# Patient Record
Sex: Female | Born: 1995 | Race: White | Hispanic: No | Marital: Single | Smoking: Never smoker
Health system: Southern US, Community
[De-identification: ages and names within clinical notes are randomized; demographics above are authoritative.]

## PROBLEM LIST (undated history)

## (undated) DIAGNOSIS — K900 Celiac disease: Secondary | ICD-10-CM

---
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
## 2015-08-06 ENCOUNTER — Observation Stay
Admission: EM | Admit: 2015-08-06 | Discharge: 2015-08-07 | Disposition: A | Discharge status: Home / Self Care | Payer: Commercial Indemnity | Attending: Surgery | Admitting: Surgery

## 2015-08-06 ENCOUNTER — Emergency Department: Payer: Commercial Indemnity

## 2015-08-06 ENCOUNTER — Encounter: Payer: Self-pay | Admitting: *Deleted

## 2015-08-06 DIAGNOSIS — Z79899 Other long term (current) drug therapy: Secondary | ICD-10-CM | POA: Diagnosis not present

## 2015-08-06 DIAGNOSIS — K900 Celiac disease: Secondary | ICD-10-CM | POA: Diagnosis not present

## 2015-08-06 DIAGNOSIS — K37 Unspecified appendicitis: Secondary | ICD-10-CM | POA: Diagnosis present

## 2015-08-06 DIAGNOSIS — K3580 Unspecified acute appendicitis: Secondary | ICD-10-CM | POA: Diagnosis not present

## 2015-08-06 DIAGNOSIS — Z793 Long term (current) use of hormonal contraceptives: Secondary | ICD-10-CM | POA: Diagnosis not present

## 2015-08-06 DIAGNOSIS — E119 Type 2 diabetes mellitus without complications: Secondary | ICD-10-CM | POA: Insufficient documentation

## 2015-08-06 HISTORY — DX: Celiac disease: K90.0

## 2015-08-06 LAB — CBC
HCT: 36.4 % (ref 35.0–47.0)
Hemoglobin: 11.5 g/dL — ABNORMAL LOW (ref 12.0–16.0)
MCH: 19.7 pg — AB (ref 26.0–34.0)
MCHC: 31.5 g/dL — AB (ref 32.0–36.0)
MCV: 62.7 fL — AB (ref 80.0–100.0)
PLATELETS: 279 10*3/uL (ref 150–440)
RBC: 5.81 MIL/uL — AB (ref 3.80–5.20)
RDW: 14.9 % — AB (ref 11.5–14.5)
WBC: 11.7 10*3/uL — ABNORMAL HIGH (ref 3.6–11.0)

## 2015-08-06 LAB — COMPREHENSIVE METABOLIC PANEL
ALBUMIN: 4.6 g/dL (ref 3.5–5.0)
ALK PHOS: 68 U/L (ref 38–126)
ALT: 22 U/L (ref 14–54)
AST: 31 U/L (ref 15–41)
Anion gap: 8 (ref 5–15)
BUN: 12 mg/dL (ref 6–20)
CALCIUM: 10 mg/dL (ref 8.9–10.3)
CHLORIDE: 103 mmol/L (ref 101–111)
CO2: 28 mmol/L (ref 22–32)
CREATININE: 0.75 mg/dL (ref 0.44–1.00)
GFR calc Af Amer: >60 mL/min (ref >60)
GFR calc non Af Amer: >60 mL/min (ref >60)
GLUCOSE: 117 mg/dL — AB (ref 65–99)
Potassium: 3.7 mmol/L (ref 3.5–5.1)
SODIUM: 139 mmol/L (ref 135–145)
Total Bilirubin: 0.4 mg/dL (ref 0.3–1.2)
Total Protein: 8.4 g/dL — ABNORMAL HIGH (ref 6.5–8.1)

## 2015-08-06 LAB — URINALYSIS COMPLETE WITH MICROSCOPIC (ARMC ONLY)
Bilirubin Urine: NEGATIVE
GLUCOSE, UA: NEGATIVE mg/dL
Hgb urine dipstick: NEGATIVE
KETONES UR: NEGATIVE mg/dL
Nitrite: NEGATIVE
PROTEIN: NEGATIVE mg/dL
Specific Gravity, Urine: 1.002 — ABNORMAL LOW (ref 1.005–1.030)
pH: 7 (ref 5.0–8.0)

## 2015-08-06 LAB — LIPASE, BLOOD: LIPASE: 38 U/L (ref 22–51)

## 2015-08-06 MED ORDER — IOHEXOL 300 MG/ML  SOLN
75.0000 mL | Freq: Once | INTRAMUSCULAR | Status: AC | PRN
  Administered 2015-08-06: 75 mL via INTRAVENOUS

## 2015-08-06 MED ORDER — IOHEXOL 240 MG/ML SOLN
25.0000 mL | Freq: Once | INTRAMUSCULAR | Status: AC | PRN
  Administered 2015-08-06: 25 mL via ORAL

## 2015-08-06 NOTE — ED Notes (Signed)
Patient transported to CT via stretcher.

## 2015-08-06 NOTE — ED Notes (Signed)
MD at bedside for eval.

## 2015-08-06 NOTE — ED Notes (Signed)
Pt reports 1 week of epigastric "sharp" pains, worse with walking and also having "pressure" pain on the right lower abdominal area. Denies n/v/d, urinary symptoms, vag bleeding/disharge.

## 2015-08-06 NOTE — ED Notes (Signed)
Lab called to add on urine pregnancy to urine already in the lab.

## 2015-08-06 NOTE — ED Notes (Signed)
poc urine pregnancy negative 

## 2015-08-06 NOTE — ED Provider Notes (Signed)
[facility] Emergency Department Provider Note  ____________________________________________  Time seen: 11:15 PM  I have reviewed the triage vital signs and the nursing notes.   HISTORY  Chief Complaint Abdominal Pain      HPI Maria Mills is a 19 y.o. female presents with generalized abdominal pain worse on the right side currently 7 out of 10. Patient also admits to epigastric discomfort times one week. Patient states that the pain is currently worse with walking described as pressure in the right lower quadrant of the abdomen. She denies any fever denies any nausea vomiting or urinary symptoms.    Past Medical History  Diagnosis Date  . Celiac disease     There are no active problems to display for this patient.   Past Surgical History  none No current outpatient prescriptions on file.  Allergies No known drug allergies No family history on file.  Social History Social History  Substance Use Topics  . Smoking status: Never Smoker   . Smokeless tobacco: None  . Alcohol Use: Yes    Review of Systems  Constitutional: Negative for fever. Eyes: Negative for visual changes. ENT: Negative for sore throat. Cardiovascular: Negative for chest pain. Respiratory: Negative for shortness of breath. Gastrointestinal: positive for abdominal pain, vomiting. Negative for diarrhea. Genitourinary: Negative for dysuria. Musculoskeletal: Negative for back pain. Skin: Negative for rash. Neurological: Negative for headaches, focal weakness or numbness.   10-point ROS otherwise negative.  ____________________________________________   PHYSICAL EXAM:  VITAL SIGNS: ED Triage Vitals  Enc Vitals Group     BP 08/06/15 2009 112/75 mmHg     Pulse Rate 08/06/15 2009 87     Resp 08/06/15 2009 18     Temp 08/06/15 2007 98.6 F (37 C)     Temp Source 08/06/15 2007 Oral     SpO2 08/06/15 2009 100 %     Weight 08/06/15 2009 127 lb (57.607 kg)   Height 08/06/15 2009  (1.626 m)     Head Cir --      Peak Flow --      Pain Score 08/06/15 2009 0     Pain Loc --      Pain Edu? --      Excl. in GC? --      Constitutional: Alert and oriented. Well appearing and in no distress. Eyes: Conjunctivae are normal. PERRL. Normal extraocular movements. ENT   Head: Normocephalic and atraumatic.   Nose: No congestion/rhinnorhea.   Mouth/Throat: Mucous membranes are moist.   Neck: No stridor. Hematological/Lymphatic/Immunilogical: No cervical lymphadenopathy. Cardiovascular: Normal rate, regular rhythm. Normal and symmetric distal pulses are present in all extremities. No murmurs, rubs, or gallops. Respiratory: Normal respiratory effort without tachypnea nor retractions. Breath sounds are clear and equal bilaterally. No wheezes/rales/rhonchi. Gastrointestinal: RLQ pain with palpation. No distention. There is no CVA tenderness. Genitourinary: deferred Musculoskeletal: Nontender with normal range of motion in all extremities. No joint effusions.  No lower extremity tenderness nor edema. Neurologic:  Normal speech and language. No gross focal neurologic deficits are appreciated. Speech is normal.  Skin:  Skin is warm, dry and intact. No rash noted. Psychiatric: Mood and affect are normal. Speech and behavior are normal. Patient exhibits appropriate insight and judgment.  ____________________________________________    LABS (pertinent positives/negatives)  Labs Reviewed  COMPREHENSIVE METABOLIC PANEL - Abnormal; Notable for the following:    Glucose, Bld 117 (*)    Total Protein 8.4 (*)    All other components within normal limits  CBC - Abnormal; Notable for the following:    WBC 11.7 (*)    RBC 5.81 (*)    Hemoglobin 11.5 (*)    MCV 62.7 (*)    MCH 19.7 (*)    MCHC 31.5 (*)    RDW 14.9 (*)    All other components within normal limits  URINALYSIS COMPLETEWITH MICROSCOPIC ([facility] ONLY) - Abnormal; Notable for the  following:    Color, Urine STRAW (*)    APPearance CLEAR (*)    Specific Gravity, Urine 1.002 (*)    Leukocytes, UA TRACE (*)    Bacteria, UA RARE (*)    Squamous Epithelial / LPF 0-5 (*)    All other components within normal limits  LIPASE, BLOOD  POC URINE PREG, ED       RADIOLOGY      CT Abdomen Pelvis W Contrast (Final result) Result time: 08/07/15 00:28:27   Final result by Rad Results In Interface (08/07/15 00:28:27)   Narrative:   CLINICAL DATA: Generalized abdominal pain. Epigastric pain with nausea and vomiting for 1 day.  EXAM: CT ABDOMEN AND PELVIS WITH CONTRAST  TECHNIQUE: Multidetector CT imaging of the abdomen and pelvis was performed using the standard protocol following bolus administration of intravenous contrast.  CONTRAST: 75mL OMNIPAQUE IOHEXOL 300 MG/ML SOLN  COMPARISON: None.  FINDINGS: Lower chest: The included lung bases are clear.  Liver: Normal in size and density. No focal lesion.  Hepatobiliary: Gallbladder physiologically distended. No biliary dilatation.  Pancreas: Normal.  Spleen: Normal. Small splenule anteriorly.  Adrenal glands: No nodule.  Kidneys: Symmetric renal enhancement. No hydronephrosis. No localizing renal abnormality.  Stomach/Bowel: Stomach physiologically distended. There are no dilated or thickened small bowel loops. Small- moderate volume of stool throughout the colon without colonic wall thickening.  Appendix: Dilated measuring 11 mm with enhancement and surrounding periappendiceal inflammatory change. No surrounding fluid collection or abscess. No perforation.  Vascular/Lymphatic: Multiple small mesenteric lymph nodes, most prominent in the ileocolic chain. No retroperitoneal adenopathy. Abdominal aorta is normal in caliber.  Reproductive: Uterus is retroverted. The ovaries are not clearly defined.  Bladder: Decompressed and not well evaluated.  Other: No free air, free fluid, or  intra-abdominal fluid collection.  Musculoskeletal: There are no acute or suspicious osseous abnormalities.  IMPRESSION: Acute appendicitis. No perforation or abscess. Multiple prominent lymph nodes in the ileocolic chain, likely reactive.   Electronically Signed By: Rubye Oaks M.D. On: 08/07/2015 00:28          INITIAL IMPRESSION / ASSESSMENT AND PLAN / ED COURSE  Pertinent labs & imaging results that were available during my care of the patient were reviewed by me and considered in my medical decision making (see chart for details).  History of physical exam consistent with acute appendicitis as such patient and her mother informed of CT findings. Patient discussed with Dr. Juliann Pulse surgeon on call  ____________________________________________   FINAL CLINICAL IMPRESSION(S) / ED DIAGNOSES  Final diagnoses:  Acute appendicitis, unspecified acute appendicitis type      Darci Current, MD 08/07/15 [phone]

## 2015-08-07 ENCOUNTER — Encounter
Admission: EM | Disposition: A | Discharge status: Home / Self Care | Payer: Self-pay | Source: Home / Self Care | Attending: Emergency Medicine

## 2015-08-07 ENCOUNTER — Encounter: Payer: Self-pay | Admitting: Surgery

## 2015-08-07 ENCOUNTER — Emergency Department: Payer: Commercial Indemnity | Admitting: Anesthesiology

## 2015-08-07 DIAGNOSIS — K37 Unspecified appendicitis: Secondary | ICD-10-CM | POA: Diagnosis present

## 2015-08-07 DIAGNOSIS — K353 Acute appendicitis with localized peritonitis: Secondary | ICD-10-CM | POA: Diagnosis not present

## 2015-08-07 HISTORY — PX: LAPAROSCOPIC APPENDECTOMY: SHX408

## 2015-08-07 LAB — POCT PREGNANCY, URINE: PREG TEST UR: NEGATIVE

## 2015-08-07 LAB — PREGNANCY, URINE: Preg Test, Ur: NEGATIVE

## 2015-08-07 SURGERY — APPENDECTOMY, LAPAROSCOPIC
Anesthesia: General

## 2015-08-07 MED ORDER — OXYCODONE-ACETAMINOPHEN 5-325 MG PO TABS: 1.0000 | ORAL_TABLET | ORAL | Status: AC | PRN

## 2015-08-07 MED ORDER — ONDANSETRON HCL 4 MG/2ML IJ SOLN
INTRAMUSCULAR | Status: DC | PRN
Start: 2015-08-07 — End: 2015-08-07
  Administered 2015-08-07: 4 mg via INTRAVENOUS

## 2015-08-07 MED ORDER — ACETAMINOPHEN 325 MG PO TABS: 650.0000 mg | ORAL_TABLET | Freq: Four times a day (QID) | ORAL | Status: DC | PRN

## 2015-08-07 MED ORDER — FENTANYL CITRATE (PF) 100 MCG/2ML IJ SOLN
INTRAMUSCULAR | Status: AC
  Filled 2015-08-07: qty 2

## 2015-08-07 MED ORDER — ONDANSETRON HCL 4 MG/2ML IJ SOLN: 4.0000 mg | Freq: Once | INTRAMUSCULAR | Status: DC | PRN

## 2015-08-07 MED ORDER — PANTOPRAZOLE SODIUM 40 MG IV SOLR: 40.0000 mg | Freq: Every day | INTRAVENOUS | Status: DC

## 2015-08-07 MED ORDER — LIDOCAINE HCL (CARDIAC) 20 MG/ML IV SOLN
INTRAVENOUS | Status: DC | PRN
  Administered 2015-08-07: 30 mg via INTRAVENOUS

## 2015-08-07 MED ORDER — LIDOCAINE HCL 1 % IJ SOLN
INTRAMUSCULAR | Status: DC | PRN
  Administered 2015-08-07: 5 mL

## 2015-08-07 MED ORDER — ERTAPENEM SODIUM 1 G IJ SOLR
1.0000 g | Freq: Once | INTRAMUSCULAR | Status: AC
  Administered 2015-08-07: 1 g via INTRAVENOUS
  Filled 2015-08-07: qty 1

## 2015-08-07 MED ORDER — GLYCOPYRROLATE 0.2 MG/ML IJ SOLN
INTRAMUSCULAR | Status: DC | PRN
  Administered 2015-08-07: 0.6 mg via INTRAVENOUS

## 2015-08-07 MED ORDER — BUPIVACAINE-EPINEPHRINE (PF) 0.25% -1:200000 IJ SOLN
INTRAMUSCULAR | Status: DC | PRN
  Administered 2015-08-07: 5 mL via PERINEURAL

## 2015-08-07 MED ORDER — FENTANYL CITRATE (PF) 100 MCG/2ML IJ SOLN
25.0000 ug | INTRAMUSCULAR | Status: AC | PRN
  Administered 2015-08-07 (×6): 25 ug via INTRAVENOUS

## 2015-08-07 MED ORDER — PROPOFOL 10 MG/ML IV BOLUS
INTRAVENOUS | Status: DC | PRN
  Administered 2015-08-07: 160 mg via INTRAVENOUS

## 2015-08-07 MED ORDER — HYDROMORPHONE HCL 1 MG/ML IJ SOLN
0.5000 mg | INTRAMUSCULAR | Status: DC | PRN
  Administered 2015-08-07 (×3): 1 mg via INTRAVENOUS
  Filled 2015-08-07 (×3): qty 1

## 2015-08-07 MED ORDER — DEXAMETHASONE SODIUM PHOSPHATE 4 MG/ML IJ SOLN
INTRAMUSCULAR | Status: DC | PRN
  Administered 2015-08-07: 4 mg via INTRAVENOUS

## 2015-08-07 MED ORDER — NEOSTIGMINE METHYLSULFATE 10 MG/10ML IV SOLN
INTRAVENOUS | Status: DC | PRN
  Administered 2015-08-07: 3 mg via INTRAVENOUS

## 2015-08-07 MED ORDER — ONDANSETRON HCL 4 MG/2ML IJ SOLN: 4.0000 mg | Freq: Four times a day (QID) | INTRAMUSCULAR | Status: DC | PRN

## 2015-08-07 MED ORDER — ROCURONIUM BROMIDE 100 MG/10ML IV SOLN: INTRAVENOUS | Status: DC | PRN

## 2015-08-07 MED ORDER — SODIUM CHLORIDE 0.9 % IR SOLN
Status: DC | PRN
  Administered 2015-08-07: 1000 mL

## 2015-08-07 MED ORDER — ROCURONIUM BROMIDE 100 MG/10ML IV SOLN
INTRAVENOUS | Status: DC | PRN
  Administered 2015-08-07: 20 mg via INTRAVENOUS

## 2015-08-07 MED ORDER — HYDROMORPHONE HCL 1 MG/ML IJ SOLN: 0.5000 mg | INTRAMUSCULAR | Status: DC | PRN

## 2015-08-07 MED ORDER — OXYCODONE-ACETAMINOPHEN 5-325 MG PO TABS
1.0000 | ORAL_TABLET | ORAL | Status: DC | PRN
  Administered 2015-08-07: 2 via ORAL
  Filled 2015-08-07: qty 2

## 2015-08-07 MED ORDER — FENTANYL CITRATE (PF) 100 MCG/2ML IJ SOLN
INTRAMUSCULAR | Status: DC | PRN
  Administered 2015-08-07: 25 ug via INTRAVENOUS
  Administered 2015-08-07: 50 ug via INTRAVENOUS
  Administered 2015-08-07: 25 ug via INTRAVENOUS
  Administered 2015-08-07: 50 ug via INTRAVENOUS
  Administered 2015-08-07: 100 ug via INTRAVENOUS

## 2015-08-07 MED ORDER — SUCCINYLCHOLINE CHLORIDE 20 MG/ML IJ SOLN
INTRAMUSCULAR | Status: DC | PRN
  Administered 2015-08-07: 80 mg via INTRAVENOUS

## 2015-08-07 MED ORDER — FENTANYL CITRATE (PF) 100 MCG/2ML IJ SOLN: 25.0000 ug | INTRAMUSCULAR | Status: DC | PRN

## 2015-08-07 MED ORDER — ONDANSETRON 4 MG PO TBDP: 4.0000 mg | ORAL_TABLET | Freq: Four times a day (QID) | ORAL | Status: DC | PRN

## 2015-08-07 MED ORDER — MIDAZOLAM HCL 2 MG/2ML IJ SOLN
INTRAMUSCULAR | Status: DC | PRN
  Administered 2015-08-07: 2 mg via INTRAVENOUS

## 2015-08-07 MED ORDER — LACTATED RINGERS IV SOLN
INTRAVENOUS | Status: DC | PRN
  Administered 2015-08-07 (×2): via INTRAVENOUS

## 2015-08-07 MED ORDER — SODIUM CHLORIDE 0.9 % IV SOLN
INTRAVENOUS | Status: DC
  Administered 2015-08-07 (×2): via INTRAVENOUS

## 2015-08-07 MED ORDER — ACETAMINOPHEN 650 MG RE SUPP: 650.0000 mg | Freq: Four times a day (QID) | RECTAL | Status: DC | PRN

## 2015-08-07 SURGICAL SUPPLY — 42 items
APPLIER CLIP ROT 10 11.4 M/L (STAPLE) ×2
BAG COUNTER SPONGE EZ (MISCELLANEOUS) IMPLANT
BENZOIN TINCTURE PRP APPL 2/3 (GAUZE/BANDAGES/DRESSINGS) ×2 IMPLANT
BLADE SURG SZ11 CARB STEEL (BLADE) ×2 IMPLANT
CANISTER SUCT 1200ML W/VALVE (MISCELLANEOUS) ×2 IMPLANT
CATH TRAY 16F METER LATEX (MISCELLANEOUS) ×2 IMPLANT
CHLORAPREP W/TINT 26ML (MISCELLANEOUS) ×2 IMPLANT
CLIP APPLIE ROT 10 11.4 M/L (STAPLE) ×1 IMPLANT
CUTTER LINEAR ENDO 35 ART THIN (STAPLE) ×2 IMPLANT
DRSG TEGADERM 2-3/8X2-3/4 SM (GAUZE/BANDAGES/DRESSINGS) ×6 IMPLANT
DRSG TEGADERM 4X4.75 (GAUZE/BANDAGES/DRESSINGS) ×2 IMPLANT
DRSG TELFA 3X8 NADH (GAUZE/BANDAGES/DRESSINGS) ×2 IMPLANT
ENDOLOOP SUT PDS II  0 18 (SUTURE)
ENDOLOOP SUT PDS II 0 18 (SUTURE) IMPLANT
ENDOPOUCH RETRIEVER 10 (MISCELLANEOUS) ×2 IMPLANT
GLOVE BIO SURGEON STRL SZ7.5 (GLOVE) ×8 IMPLANT
GOWN STRL REUS W/ TWL LRG LVL3 (GOWN DISPOSABLE) ×2 IMPLANT
GOWN STRL REUS W/TWL LRG LVL3 (GOWN DISPOSABLE) ×2
IRRIGATION STRYKERFLOW (MISCELLANEOUS) ×1 IMPLANT
IRRIGATOR STRYKERFLOW (MISCELLANEOUS) ×2
IV NS 1000ML (IV SOLUTION) ×1
IV NS 1000ML BAXH (IV SOLUTION) ×1 IMPLANT
KIT RM TURNOVER STRD PROC AR (KITS) ×2 IMPLANT
LABEL OR SOLS (LABEL) IMPLANT
NEEDLE HYPO 25X1 1.5 SAFETY (NEEDLE) ×2 IMPLANT
NS IRRIG 500ML POUR BTL (IV SOLUTION) ×2 IMPLANT
PACK LAP CHOLECYSTECTOMY (MISCELLANEOUS) ×2 IMPLANT
PAD GROUND ADULT SPLIT (MISCELLANEOUS) ×2 IMPLANT
RELOAD CUTTER ETS 35MM STAND (ENDOMECHANICALS) ×4 IMPLANT
SCISSORS METZENBAUM CVD 33 (INSTRUMENTS) ×2 IMPLANT
SEAL FOR SCOPE WARMER C3101 (MISCELLANEOUS) IMPLANT
SLEEVE ENDOPATH XCEL 5M (ENDOMECHANICALS) ×2 IMPLANT
STRIP CLOSURE SKIN 1/2X4 (GAUZE/BANDAGES/DRESSINGS) ×2 IMPLANT
SUT MNCRL 4-0 (SUTURE) ×1
SUT MNCRL 4-0 27XMFL (SUTURE) ×1
SUT VICRYL 0 AB UR-6 (SUTURE) ×4 IMPLANT
SUTURE MNCRL 4-0 27XMF (SUTURE) ×1 IMPLANT
SWABSTK COMLB BENZOIN TINCTURE (MISCELLANEOUS) ×2 IMPLANT
TROCAR XCEL BLUNT TIP 100MML (ENDOMECHANICALS) ×2 IMPLANT
TROCAR XCEL NON-BLD 5MMX100MML (ENDOMECHANICALS) ×2 IMPLANT
TUBING INSUFFLATOR HI FLOW (MISCELLANEOUS) ×2 IMPLANT
WATER STERILE IRR 1000ML POUR (IV SOLUTION) IMPLANT

## 2015-08-07 NOTE — ED Notes (Signed)
Lab notified to add on urine pregnancy to urine sample sent earlier this visit.

## 2015-08-07 NOTE — Discharge Instructions (Signed)
Do not drive on pain medications °Do not lift greater than 15 lbs for a period of 6 weeks °Call or return to ER if you develop fever greater than 101.5, nausea/vomiting, increased pain, redness/drainage from incisions °Take bandages off in 48 hours.  Okay to shower with bandages on or after they come off, no tub baths °

## 2015-08-07 NOTE — Op Note (Signed)
Preoperative dx: Appendicitis Postoperative dx: Appendicitis Procedure: Laparoscopic appendectomy  Surgeon: Marie Chow Anesthesia: GETA EBL: 10 ml Specimen: Appendix.  Indication for procedure: Maria Mills presented with RLQ pain and clinical and radiographic signs of appendicitis.  We brought her to the OR for surgical management of appendicitis.  Details of procedure:  Informed consent was obtained.  Maria Mills was brought to the or suite and laid on the OR table.  She was induced, intubated and general anesthesia administered.  Her abdomen was prepped and draped.  Time out performed.  Incision was made over umbilicus.  Deepened down to fascia and fascia incised.  Two stay sutures placed in fasciotomy, hassan trocar placed, abdomen was insufflated.  Two 5 mm trocars were placed under direct visualization in RLQ and suprapubic.  The appendix was visualized and remarkably inflammed.  Hole was made in mesoappendix and appendix transected flush with cecum using endoGIA stapler.  Mesoappendix was then divided with 2 firings of the endoGIA.  The mesoappendix was markedly thickened and the right colon and TI was partially mobilized to ensure that the mesoappendix was free of any bowel.  In addition, the right ovary was visualized to ensure that the ovary was not associated with the appendix and that this thickened mesoappendix was not due to ovarian involvement.  Appendix then removed using endocatch bag.  The appendix was examined on the back table and was visualized on the colonic side and markedly inflamed at the tip with large amount of periappendiceal scarring.  The abdomen was then irrigated and hemostasis obtained.  All trocars were then removed under direct visualization.  Supraumbilical fascia closed using previously placed stay sutures.  All skin sites closed with 4-0 monocryl interruped deep dermal sutures.  Steristrips, telfa and tegaderm were used to complete dressing.  Patient awoken, extubated  and brought to PACU.  No immediate complications, needle, sponge and instrument count correct at end of procedure.

## 2015-08-07 NOTE — Transfer of Care (Signed)
Immediate Anesthesia Transfer of Care Note  Patient: Maria Mills  Procedure(s) Performed: Procedure(s): APPENDECTOMY LAPAROSCOPIC (N/A)  Patient Location: PACU  Anesthesia Type:General  Level of Consciousness: awake, alert , oriented and patient cooperative  Airway & Oxygen Therapy: Patient Spontanous Breathing and Patient connected to face mask oxygen  Post-op Assessment: Report given to RN and Post -op Vital signs reviewed and stable  Post vital signs: Reviewed and stable  Last Vitals:  Filed Vitals:   08/06/15 2344  BP: 121/70  Pulse: 84  Temp:   Resp: 18    Complications: No apparent anesthesia complications

## 2015-08-07 NOTE — H&P (Signed)
CC: Abdominal pain, bloating, nausea/vomiting  HPI: Maria Mills is a pleasant 19 yo F who presents with approx 1 week of bloating and abdominal pain.  Pain initially in epigastric area but is tender to palpation RLQ.  + Anorexia.  Has never had this pain before.  Has celiac disease but pain is not like that and is very rigid at maintaining a gluten free diet.  No fevers/chills, night sweats, shortness of breath, chest pain, diarrhea/constipation, dysuria/hematuria.  WBC 11, CT scan shows thickened, dilated appendix with periappendiceal stranding.  Active Ambulatory Problems    Diagnosis Date Noted  . No Active Ambulatory Problems   Resolved Ambulatory Problems    Diagnosis Date Noted  . No Resolved Ambulatory Problems   Past Medical History  Diagnosis Date  . Celiac disease    History reviewed. No pertinent past surgical history.     Medication List    ASK your doctor about these medications        JUNEL 1.5/30 1.5-30 MG-MCG tablet  Generic drug:  Norethindrone Acetate-Ethinyl Estradiol  Take 1 tablet by mouth daily.       No Known Allergies   Social History   Social History  . Marital Status: Single    Spouse Name: N/A  . Number of Children: N/A  . Years of Education: N/A   Occupational History  . Not on file.   Social History Main Topics  . Smoking status: Never Smoker   . Smokeless tobacco: Not on file  . Alcohol Use: Yes  . Drug Use: No  . Sexual Activity: Not on file   Other Topics Concern  . Not on file   Social History Narrative  . No narrative on file   No family history on file.  GM with DM  ROS: Full ROS obtained, pertinent positives and negatives as above.  Blood pressure 121/70, pulse 84, temperature 98.6 F (37 C), temperature source Oral, resp. rate 18, height  (1.626 m), weight 127 lb (57.607 kg), last menstrual period 07/22/2015, SpO2 100 %. GEN: NAD/A&Ox3 FACE: no obvious facial trauma, normal external nose, normal external  ears EYES: no scleral icterus, no conjunctivitis HEAD: normocephalic atraumatic CV: RRR, no MRG RESP: moving air well, lungs clear ABD: soft, tender to palp RLQ with rebound, nondistended EXT: moving all ext well, strength 5/5 NEURO: cnII-XII grossly intact, sensation intact all 4 ext  Labs: Personally reviewed, significant for WBC 11.7  Imaging: Personally reviewed, significant for dilated thickened appendix with periappendiceal stranding  A/P 19 yo F with acute appendicitis.  Although I am not sure how to account for diffuse abdominal pain, she is quite tender in her RLQ.  WBC elevated, radiographic appendicitis.  I have recommended appendectomy and have discussed the procedure with patient and her mother.  I have described the potential risks and benefits and she would like to proceed.

## 2015-08-07 NOTE — Anesthesia Preprocedure Evaluation (Signed)
Anesthesia Evaluation  Patient identified by MRN, date of birth, ID band Patient awake    Reviewed: Allergy & Precautions, NPO status , Patient's Chart, lab work & pertinent test results  Airway Mallampati: II       Dental no notable dental hx.    Pulmonary neg pulmonary ROS,    Pulmonary exam normal        Cardiovascular negative cardio ROS Normal cardiovascular exam     Neuro/Psych negative neurological ROS     GI/Hepatic negative GI ROS, Neg liver ROS,   Endo/Other  negative endocrine ROS  Renal/GU negative Renal ROS  negative genitourinary   Musculoskeletal negative musculoskeletal ROS (+)   Abdominal Normal abdominal exam  (+)   Peds  Hematology negative hematology ROS (+)   Anesthesia Other Findings   Reproductive/Obstetrics                             Anesthesia Physical Anesthesia Plan  ASA: I and emergent  Anesthesia Plan: General   Post-op Pain Management:    Induction: Intravenous  Airway Management Planned: Oral ETT  Additional Equipment:   Intra-op Plan:   Post-operative Plan: Extubation in OR  Informed Consent: I have reviewed the patients History and Physical, chart, labs and discussed the procedure including the risks, benefits and alternatives for the proposed anesthesia with the patient or authorized representative who has indicated his/her understanding and acceptance.     Plan Discussed with: CRNA  Anesthesia Plan Comments:         Anesthesia Quick Evaluation

## 2015-08-07 NOTE — Brief Op Note (Signed)
08/06/2015 - 08/07/2015  3:59 AM  PATIENT:  Maria Mills  19 y.o. female  PRE-OPERATIVE DIAGNOSIS:  acute appendicitis  POST-OPERATIVE DIAGNOSIS:  acute appendicitis  PROCEDURE:  Procedure(s): APPENDECTOMY LAPAROSCOPIC (N/A)  SURGEON:  Surgeon(s) and Role:    * Ida Rogue, MD - Primary  PHYSICIAN ASSISTANT:   ASSISTANTS: none   ANESTHESIA:   general  EBL:   15 ml  BLOOD ADMINISTERED:none  DRAINS: none   LOCAL MEDICATIONS USED:  LIDOCAINE   SPECIMEN:  Excision  DISPOSITION OF SPECIMEN:  PATHOLOGY  COUNTS:  YES  TOURNIQUET:  * No tourniquets in log *  DICTATION: .Note written in EPIC  PLAN OF CARE: Admit for overnight observation  PATIENT DISPOSITION:  PACU - hemodynamically stable.   Delay start of Pharmacological VTE agent (>24hrs) due to surgical blood loss or risk of bleeding: not applicable

## 2015-08-07 NOTE — ED Notes (Signed)
Admitting MD at bedside for eval.

## 2015-08-07 NOTE — Anesthesia Procedure Notes (Signed)
Procedure Name: Intubation Date/Time: 08/07/2015 2:12 AM Performed by: Waldo Laine Pre-anesthesia Checklist: Emergency Drugs available, Patient identified, Suction available, Patient being monitored and Timeout performed Patient Re-evaluated:Patient Re-evaluated prior to inductionPreoxygenation: Pre-oxygenation with 100% oxygen Intubation Type: IV induction, Rapid sequence and Cricoid Pressure applied Laryngoscope Size: Miller and 1 Grade View: Grade I Tube type: Oral Number of attempts: 1 Airway Equipment and Method: Stylet Placement Confirmation: ETT inserted through vocal cords under direct vision,  positive ETCO2 and breath sounds checked- equal and bilateral Secured at: 20 cm Tube secured with: Tape

## 2015-08-08 LAB — SURGICAL PATHOLOGY

## 2015-08-08 NOTE — Anesthesia Postprocedure Evaluation (Signed)
  Anesthesia Post-op Note  Patient: Maria Mills  Procedure(s) Performed: Procedure(s): APPENDECTOMY LAPAROSCOPIC (N/A)  Anesthesia type:General  Patient location: PACU  Post pain: Pain level controlled  Post assessment: Post-op Vital signs reviewed, Patient's Cardiovascular Status Stable, Respiratory Function Stable, Patent Airway and No signs of Nausea or vomiting  Post vital signs: Reviewed and stable  Last Vitals:  Filed Vitals:   08/07/15 1702  BP: 117/62  Pulse: 77  Temp: 37.1 C  Resp: 16    Level of consciousness: awake, alert  and patient cooperative  Complications: No apparent anesthesia complications

## 2015-08-16 NOTE — Addendum Note (Signed)
Addendum  created 08/16/15 1536 by Lezlie Octave, MD   Modules edited: Anesthesia Events, Narrator   Narrator:  Narrator: Event Log Edited

## 2016-02-29 IMAGING — CT CT ABD-PELV W/ CM
1 of 2 series · 15 of 32 positions shown, 19 images · IV contrast (omnipaque)
Comparison: None.

CLINICAL DATA: Generalized abdominal pain. Epigastric pain with
nausea and vomiting for 1 day.

EXAM:
CT ABDOMEN AND PELVIS WITH CONTRAST
TECHNIQUE: Multidetector CT imaging of the abdomen and pelvis was performed
using the standard protocol following bolus administration of
intravenous contrast.
CONTRAST:  75mL OMNIPAQUE IOHEXOL 300 MG/ML  SOLN

[Series 2: routine abd pel with · axial · 0.56mm/px · z∈[-481,-96]mm · 15 of 85 slices shown, 19 images]
[im 4/85  soft-tissue]
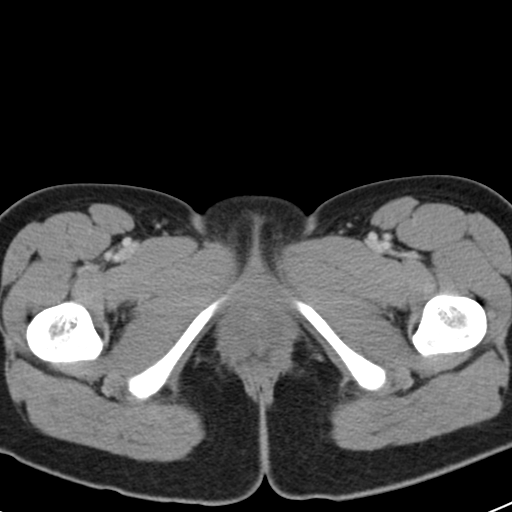
[im 4/85  bone]
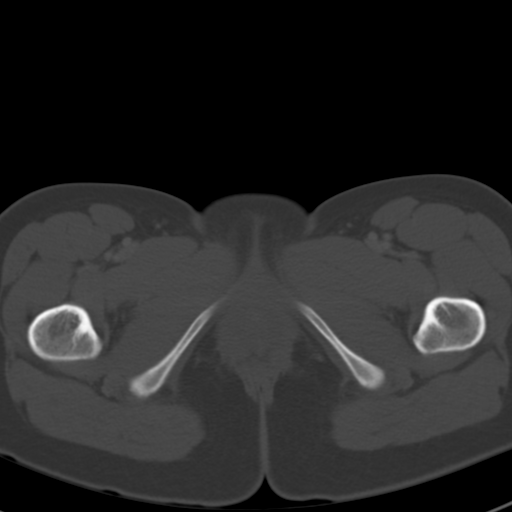
[im 11/85  soft-tissue]
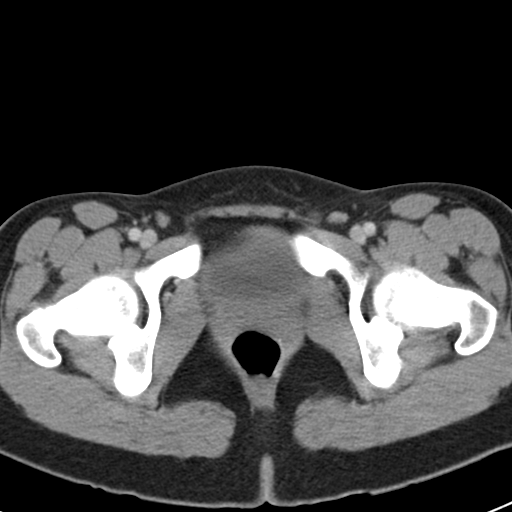
[im 18/85  soft-tissue]
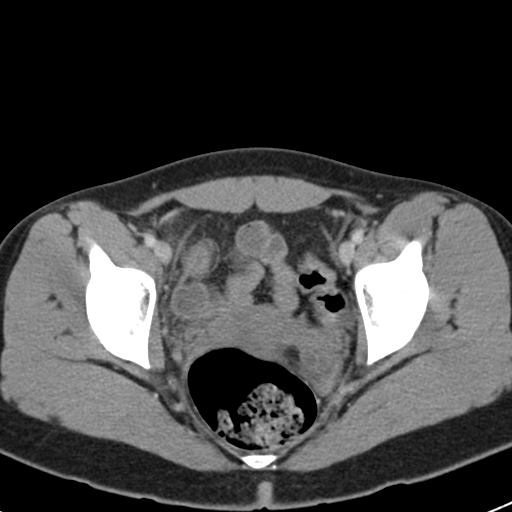
[im 25/85  soft-tissue]
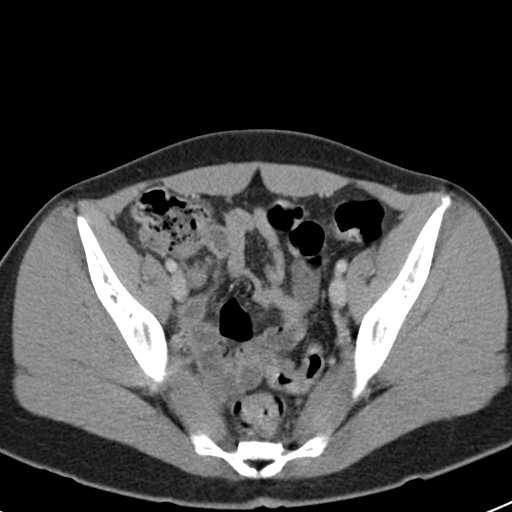
[im 29/85  soft-tissue]
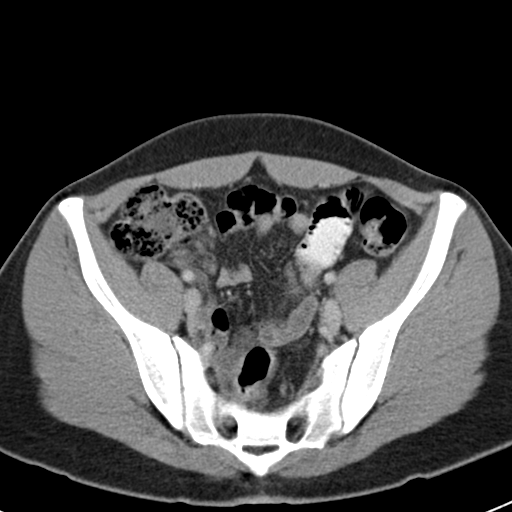
[im 36/85  soft-tissue]
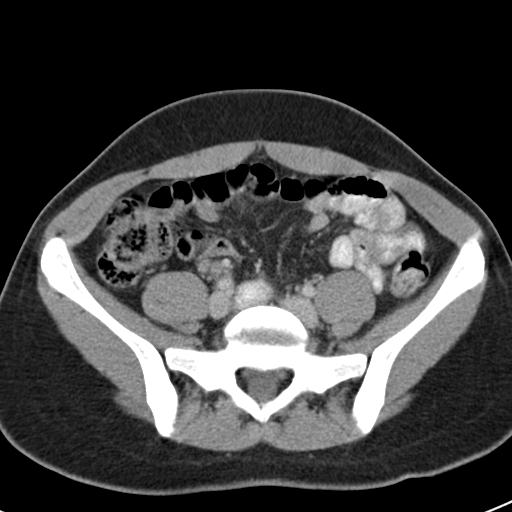
[im 43/85  soft-tissue]
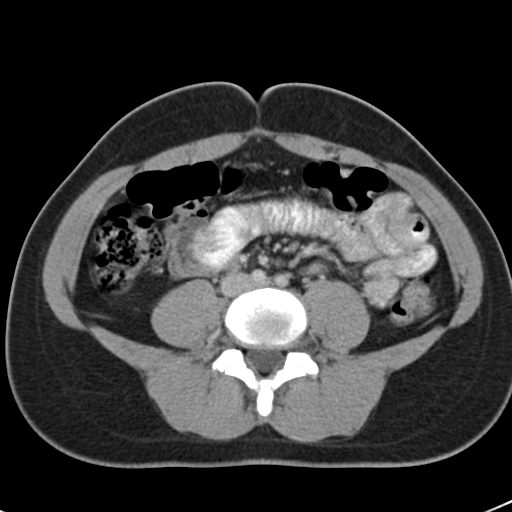
[im 50/85  soft-tissue]
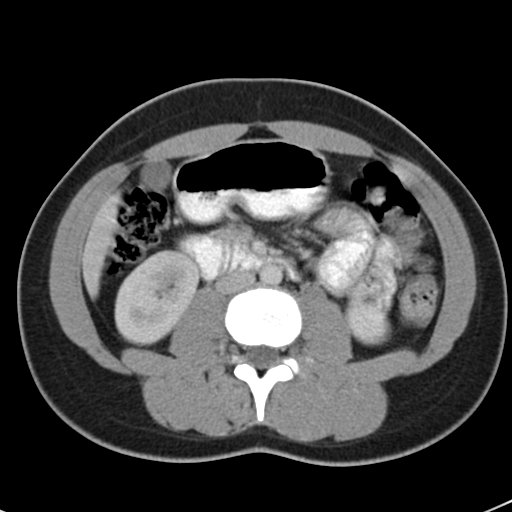
[im 57/85  soft-tissue]
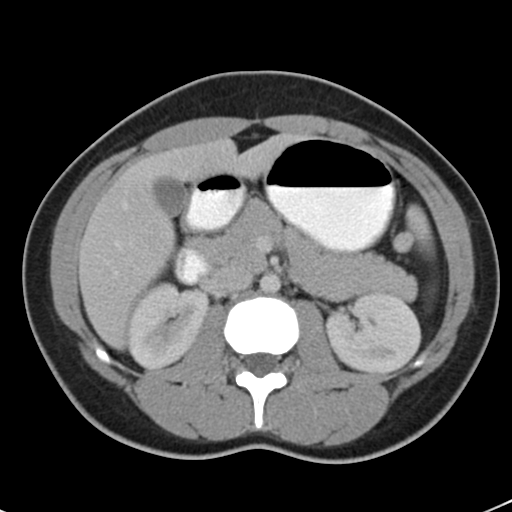
[im 57/85  bone]
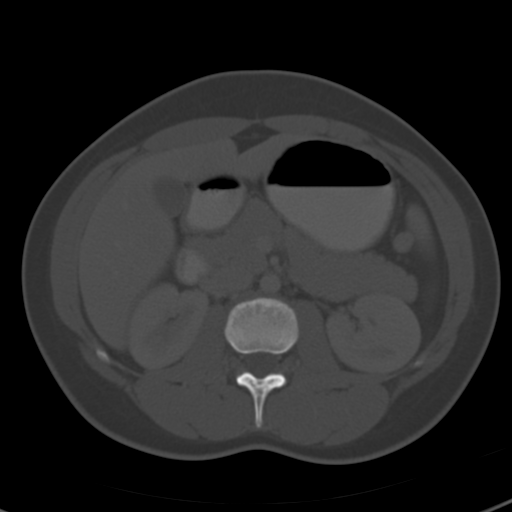
[im 60/85  soft-tissue]
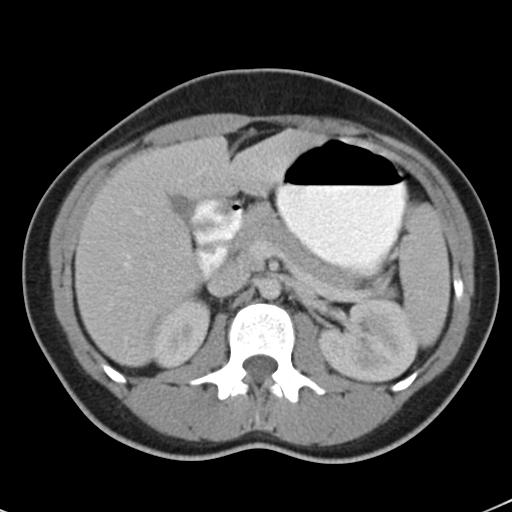
[im 67/85  soft-tissue]
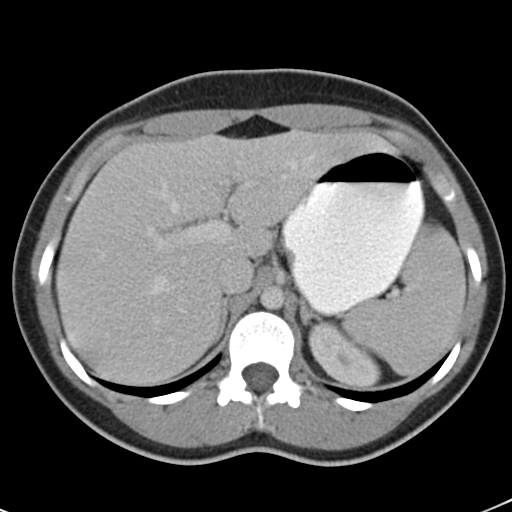
[im 71/85  lung]
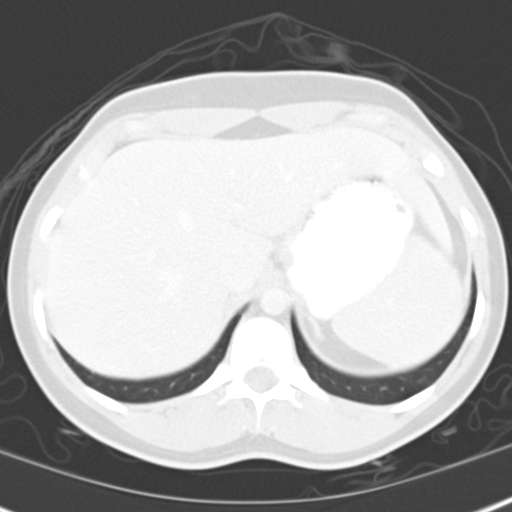
[im 74/85  soft-tissue]
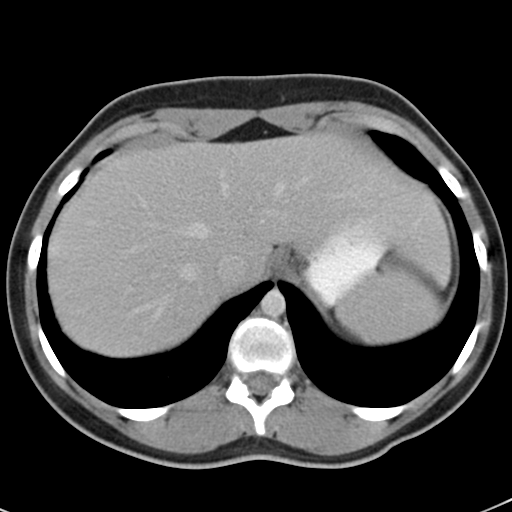
[im 74/85  lung]
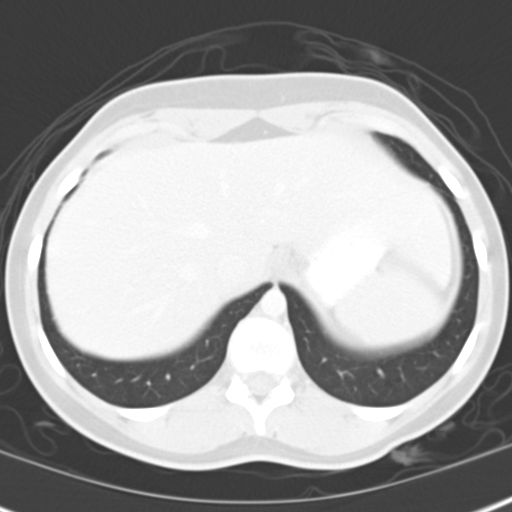
[im 78/85  lung]
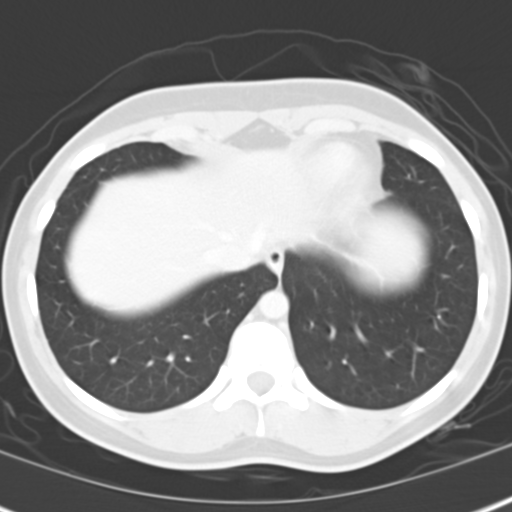
[im 81/85  soft-tissue]
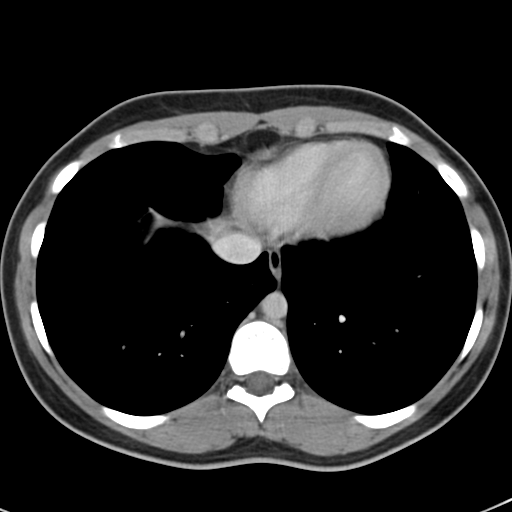
[im 81/85  lung]
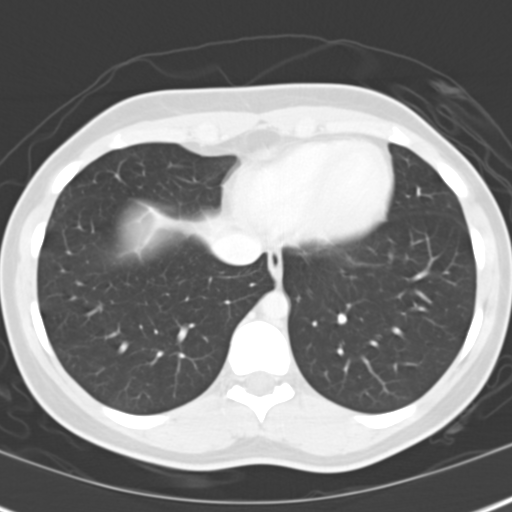

[15 of 32 positions shown; findings below may reference images not displayed]

FINDINGS: Lower chest:  The included lung bases are clear.

Liver: Normal in size and density.  No focal lesion.

Hepatobiliary: Gallbladder physiologically distended. No biliary
dilatation.

Pancreas: Normal.

Spleen: Normal.  Small splenule anteriorly.

Adrenal glands: No nodule.

Kidneys: Symmetric renal enhancement. No hydronephrosis. No
localizing renal abnormality.

Stomach/Bowel: Stomach physiologically distended. There are no
dilated or thickened small bowel loops. Small- moderate volume of
stool throughout the colon without colonic wall thickening.

Appendix: Dilated measuring 11 mm with enhancement and surrounding
periappendiceal inflammatory change. No surrounding fluid collection
or abscess. No perforation.

Vascular/Lymphatic: Multiple small mesenteric lymph nodes, most
prominent in the ileocolic chain. No retroperitoneal adenopathy.
Abdominal aorta is normal in caliber.

Reproductive: Uterus is retroverted. The ovaries are not clearly
defined.

Bladder: Decompressed and not well evaluated.

Other: No free air, free fluid, or intra-abdominal fluid collection.

Musculoskeletal: There are no acute or suspicious osseous
abnormalities.
IMPRESSION: Acute appendicitis. No perforation or abscess. Multiple prominent
lymph nodes in the ileocolic chain, likely reactive.

## 2019-07-11 DIAGNOSIS — J301 Allergic rhinitis due to pollen: Secondary | ICD-10-CM | POA: Insufficient documentation

## 2019-09-21 ENCOUNTER — Other Ambulatory Visit (INDEPENDENT_AMBULATORY_CARE_PROVIDER_SITE_OTHER): Payer: Self-pay

## 2019-09-21 ENCOUNTER — Encounter (INDEPENDENT_AMBULATORY_CARE_PROVIDER_SITE_OTHER): Payer: Self-pay | Admitting: Family Medicine

## 2019-09-21 ENCOUNTER — Other Ambulatory Visit: Payer: Self-pay | Admitting: Family

## 2019-09-21 ENCOUNTER — Ambulatory Visit (INDEPENDENT_AMBULATORY_CARE_PROVIDER_SITE_OTHER): Payer: Commercial Managed Care - POS | Admitting: Family Medicine

## 2019-09-21 VITALS — BP 110/80 | HR 60 | Temp 97.9°F | Resp 15 | Ht 64.25 in | Wt 126.0 lb

## 2019-09-21 DIAGNOSIS — M25661 Stiffness of right knee, not elsewhere classified: Secondary | ICD-10-CM

## 2019-09-21 DIAGNOSIS — M25561 Pain in right knee: Secondary | ICD-10-CM

## 2019-09-21 DIAGNOSIS — Z23 Encounter for immunization: Secondary | ICD-10-CM

## 2019-09-21 NOTE — Progress Notes (Addendum)
[facility]                       Date of Exam: 09/21/2019 3:55 PM        Patient ID: Taylor Ball is a 23 y.o. female.  Attending Physician: Oralia Rud, MD        Chief Complaint:    Chief Complaint   Patient presents with    Knee Injury     R knee x 2 weeks               HPI:    Knee Pain   The incident occurred more than 1 week ago. There was no injury mechanism. The pain is present in the right knee. The quality of the pain is described as aching. The pain has been constant since onset. Associated symptoms include an inability to bear weight and a loss of motion. Pertinent negatives include no loss of sensation, muscle weakness, numbness or tingling. The symptoms are aggravated by movement and weight bearing. She has tried nothing for the symptoms.             Problem List:    Patient Active Problem List   Diagnosis    Celiac disease    Appendicitis    Allergic rhinitis due to pollen             Current Meds:    No outpatient medications have been marked as taking for the 09/21/19 encounter (Office Visit) with Oralia Rud, MD.          Allergies:    Allergies   Allergen Reactions    Gluten Meal      Other reaction(s): Other (See Comments)  Celiacs Disease    Wheat Bran Other (See Comments)     Celiacs Disease    Ibuprofen Angioedema             Past Surgical History:    History reviewed. No pertinent surgical history.        Family History:    History reviewed. No pertinent family history.        Social History:    Social History     Tobacco Use    Smoking status: Never Smoker    Smokeless tobacco: Never Used   Substance Use Topics    Alcohol use: Yes    Drug use: Never          The following sections were reviewed this encounter by the provider:   Tobacco   Allergies   Meds   Problems   Med Hx   Surg Hx   Fam Hx              Vital Signs:    BP 110/80 (BP Site: Right arm, Patient Position: Sitting, Cuff Size: Medium)    Pulse 60    Temp 97.9 F  (36.6 C) (Tympanic)    Resp 15    Ht 1.632 m (5' 4.25")    Wt 57.2 kg (126 lb)    LMP  (Within Months) Comment: about 8 months ago   BMI 21.46 kg/m          ROS:    Review of Systems   Constitutional: Negative for fever.   Cardiovascular: Negative for leg swelling.   Musculoskeletal: Positive for arthralgias, gait problem and joint swelling.   Neurological: Negative for tingling and numbness.  Physical Exam:    Physical Exam  Constitutional:       Appearance: Normal appearance. She is normal weight.   HENT:      Head: Normocephalic and atraumatic.   Eyes:      Extraocular Movements: Extraocular movements intact.      Pupils: Pupils are equal, round, and reactive to light.   Pulmonary:      Effort: Pulmonary effort is normal.   Musculoskeletal:      Right knee: She exhibits decreased range of motion (135 w/ flexion and 5 with extension. Unable to fully straighten) and effusion (mild). She exhibits no LCL laxity and no MCL laxity. No tenderness found.   Skin:     General: Skin is warm.      Capillary Refill: Capillary refill takes less than 2 seconds.   Neurological:      General: No focal deficit present.      Mental Status: She is alert and oriented to person, place, and time.   Psychiatric:         Mood and Affect: Mood normal.         Behavior: Behavior normal.         Thought Content: Thought content normal.         Judgment: Judgment normal.              Assessment:    1. Acute pain of right knee  - [facility] [place] LOWER EXTERMITY; Future  - MRI Knee Right WO Contrast; Future  - XR Knee Right 4+ Views; Future    2. Decreased range of motion (ROM) of right knee  - [facility] [place] LOWER EXTERMITY; Future  - MRI Knee Right WO Contrast; Future  - XR Knee Right 4+ Views; Future    3. Immunization due  - Flu vaccine QUAD 6 mos & up PRESERVED            Plan:        Patient is being seen today for acute onset right knee pain with a reduced range of motion with extension.  She is an active runner, with moderate daily  mileage.  No point tenderness, but inability to fully extend makes me suspicious for a meniscal injury.  Ordered x-ray to evaluate for bony abnormality.  Ultrasound was normal.  We will order MRI, if x-ray as XR is normal        Follow-up:    No follow-ups on file.         Oralia Rud, MD

## 2019-09-21 NOTE — Procedures (Signed)
ANTERIOR KNEE MSK ULTRASONOGRAPHY    Patient Name:  Taylor Ball  Date of Exam:  September 21, 2019  Time of Exam:  3:55 PM  Ordering Provider: Oralia Rud, MD    Indications for study:  right knee pain      Limited musculoskeletal ultrasound examination was performed on the right anterior knee with the following findings:     Quadriceps tendon - long:                   normal echogenic, linearly compact fibrillar appearance.    Quadriceps tendon - trans:                  normal echogenic, tessellate compact fibrillar appearance.    Suprapatellar recess - long:                 normal appearance with no evidence of cyst or ganglion.    Suprapatellar fat pads:                         normal appearing fat pad(s).    Patellar tendon - long:                          normal echogenic, linearly compact fibrillar appearance.    Patellar tendon - trans:                         normal echogenic, tessellate compact fibrillar appearance.    Hoffa's fat pad:                                     normal appearing fat pad(s).    Medial patellofemoral ligament - long:  normal linearly very-compact trilaminar appearance normally prone to anisotropy    Lateral patellofemoral ligament - long:  normal linearly very-compact trilaminar appearance normally prone to anisotropy      Impression:    Very mild suprapatellar effusion, otherwise normal [place]      Scanned and Interpreted by:   Oralia Rud, MD  Sports Medicine  [facility]

## 2019-09-28 ENCOUNTER — Telehealth (INDEPENDENT_AMBULATORY_CARE_PROVIDER_SITE_OTHER): Payer: Self-pay | Admitting: Family Medicine

## 2019-09-28 NOTE — Telephone Encounter (Signed)
Called spoke to pt - MRI scheduled on 10/05/19- closing task- [employer]

## 2019-10-06 ENCOUNTER — Telehealth (INDEPENDENT_AMBULATORY_CARE_PROVIDER_SITE_OTHER): Payer: Self-pay | Admitting: Family Medicine

## 2019-10-06 NOTE — Telephone Encounter (Signed)
Pt called and stated that she went to [facility] last night for her MRI and was told that her insurance and provider had to authorize the MRI. Pt is requesting that it be addressed so that she can have the MRI.

## 2019-10-06 NOTE — Telephone Encounter (Signed)
Please contact [facility].     If the MRI was denied then we need the denial letter so we know what needs to be done next

## 2019-10-06 NOTE — Telephone Encounter (Signed)
Called [facility] spoke to [place]- they reached out to pt to advise that prior auth has not been authorized still wtg- will call pt to reschedule once approved    Faxed all information to [employer] spoke to pt to advise- closing task- [employer]

## 2019-10-06 NOTE — Telephone Encounter (Signed)
Saw in a previous telephone message that you had call pt.       Note     Called spoke to pt - MRI scheduled on 10/05/19- closing task- [employer] like they were turned away

## 2019-11-01 ENCOUNTER — Encounter (INDEPENDENT_AMBULATORY_CARE_PROVIDER_SITE_OTHER): Payer: Self-pay

## 2019-11-28 ENCOUNTER — Telehealth (INDEPENDENT_AMBULATORY_CARE_PROVIDER_SITE_OTHER): Payer: Self-pay | Admitting: Family Medicine

## 2019-11-28 NOTE — Telephone Encounter (Signed)
Pt called and scheduled a VV on Thursday to discuss MRI results. PAA did not see the results in her chart but pt states that she had them on Saturday at [facility] in [place].

## 2019-11-29 NOTE — Telephone Encounter (Signed)
Can you please reach out to [facility] to obtain MRI results?

## 2019-11-30 NOTE — Telephone Encounter (Signed)
Handed to josh

## 2019-11-30 NOTE — Telephone Encounter (Signed)
Called [facility] they are faxing report

## 2019-12-01 ENCOUNTER — Encounter (INDEPENDENT_AMBULATORY_CARE_PROVIDER_SITE_OTHER): Payer: Self-pay | Admitting: Family Medicine

## 2019-12-01 ENCOUNTER — Telehealth (INDEPENDENT_AMBULATORY_CARE_PROVIDER_SITE_OTHER): Payer: Commercial Managed Care - POS | Admitting: Family Medicine

## 2019-12-01 VITALS — Wt 120.0 lb

## 2019-12-01 DIAGNOSIS — M93261 Osteochondritis dissecans, right knee: Secondary | ICD-10-CM

## 2019-12-01 DIAGNOSIS — M2341 Loose body in knee, right knee: Secondary | ICD-10-CM

## 2019-12-01 NOTE — Patient Instructions (Signed)
Information on OCD:  https://orthoinfo.aaos.org/en/diseases--conditions/osteochondritis-dissecans/

## 2019-12-01 NOTE — Progress Notes (Signed)
[facility] [place] [facility]                       Date of Virtual Visit: 12/01/2019 4:32 PM        Patient ID: Taylor Ball is a 24 y.o. female.  Attending Physician: Oralia Rud, MD       Telemedicine Eligibility:      Do you give [place] permission to submit this claim to your insurance and/or bill you for any charges not paid for insurance:    [x]  Yes   []  No    State Location:    [x]  [place]  []  [place]  []  [address] [place] []  [place]    []  Other (specify):    Patient Identity Verification:    [x]  State Issued ID  []  DOB / photo ID  []  Other (specify):           Chief Complaint:    Knee Pain (follow up)               HPI:    Due to current pandemic of COVID 19, patient being seen through a virtual video visit to minimize infectious disease risk to themselves and our medical office staff.    12/01/2019  The paitnet is present to review the results of her MRI.    MRI right knee on 11/26/19  1. There is a 12 x 15mm area of osteochondritis desiccans of the lateral trochlea. A portion of the lesion is loose in situ. There is fragmentation and loose body formation arising from the superior aspect of the lesion  2. There is a 6x57mm chondral loose body within the posterolateral aspect of the joint.  3. The menisci are normal  4. The collateral and curciate ligaments are intact    Xray right knee 09/21/2019  No acute osseous abnormality or significant degenerative changes.    Plan from 09/21/19 visit  Patient is being seen today for acute onset right knee pain with a reduced range of motion with extension.  She is an active runner, with moderate daily mileage.  No point tenderness, but inability to fully extend makes me suspicious for a meniscal injury.  Ordered x-ray to evaluate for bony abnormality.  Ultrasound was normal.  We will order MRI, if x-ray as XR is normal          Problem List:    Patient Active Problem List   Diagnosis    Celiac disease    Appendicitis    Allergic  rhinitis due to pollen             Current Meds:    Current Outpatient Medications   Medication Sig Dispense Refill    estradiol (ESTRACE) 2 MG tablet Take 2 mg by mouth daily As needed       No current facility-administered medications for this visit.           Allergies:    Allergies   Allergen Reactions    Gluten Meal      Other reaction(s): Other (See Comments)  Celiacs Disease    Wheat Bran Other (See Comments)     Celiacs Disease    Ibuprofen Angioedema             Past Surgical History:    History reviewed. No pertinent surgical history.        Family History:    History reviewed. No pertinent family history.  Social History:    Social History     Tobacco Use    Smoking status: Never Smoker    Smokeless tobacco: Never Used   Substance Use Topics    Alcohol use: Yes    Drug use: Never          The following sections were reviewed this encounter by the provider:   Tobacco   Allergies   Meds   Problems   Med Hx   Surg Hx   Fam Hx              Vital Signs:    Wt 54.4 kg (120 lb)    BMI 20.44 kg/m          ROS:    Review of Systems   Musculoskeletal: Positive for arthralgias and gait problem. Negative for joint swelling.   Neurological: Negative for weakness and numbness.             Physical Exam:      GENERAL APPEARANCE: alert, in no acute distress, pleasant, well nourished.   HEAD: normal appearance  EYES: no discharge  EARS: normal hearing  NECK/THYROID: appearance -supple  PSYCH: appropriate affect, appropriate mood, normal speech, normal attention          Assessment:    1. Osteochondritis dissecans of knee, right  - Ambulatory referral to Orthopedic Surgery    2. 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  Normal x-ray, normal ultrasound  Recently followed up with MRI, which does show osteochondritis dissecans as well as a chondral loose body.  Recommend follow-up with  orthopedics for surgical discussion        Follow-up:    No follow-ups on file.         Oralia Rud, MD

## 2019-12-02 ENCOUNTER — Telehealth (INDEPENDENT_AMBULATORY_CARE_PROVIDER_SITE_OTHER): Payer: Self-pay | Admitting: Family Medicine

## 2019-12-02 NOTE — Telephone Encounter (Signed)
Orthopedic surgery referral posted to my chart- pt to schedule- no insurance referral required- closing task- [employer]

## 2019-12-26 ENCOUNTER — Ambulatory Visit (INDEPENDENT_AMBULATORY_CARE_PROVIDER_SITE_OTHER): Payer: Commercial Managed Care - POS

## 2019-12-26 DIAGNOSIS — Z1159 Encounter for screening for other viral diseases: Secondary | ICD-10-CM

## 2019-12-26 DIAGNOSIS — Z01818 Encounter for other preprocedural examination: Secondary | ICD-10-CM

## 2019-12-28 LAB — COVID-19 (SARS-COV-2): SARS CoV 2 Overall Result: NOT DETECTED

## 2020-02-11 ENCOUNTER — Ambulatory Visit (INDEPENDENT_AMBULATORY_CARE_PROVIDER_SITE_OTHER): Payer: Commercial Managed Care - POS

## 2020-02-11 DIAGNOSIS — Z01818 Encounter for other preprocedural examination: Secondary | ICD-10-CM

## 2020-02-11 DIAGNOSIS — Z20822 Contact with and (suspected) exposure to covid-19: Secondary | ICD-10-CM

## 2020-02-12 LAB — COVID-19 (SARS-COV-2): SARS CoV 2 Overall Result: NOT DETECTED

## 2020-02-14 ENCOUNTER — Telehealth: Payer: Self-pay

## 2020-02-14 NOTE — Telephone Encounter (Signed)
COVID-19 Test Results Call Center    Received call from [name] Surgical Scheduler at [facility]: [phone] requesting Taylor Ball COVID-19 test results.    Verified patient's name, DOB, address.       Reviewed chart and provided COVID-19 test result for 02/11/2020.    Twi, request test result be faxed to: [phone], RN read back for confirmation, and routed test result.    Lorraine Lax BSN RN ACM-RN  COVID-19 Notification Team  COVID-19 Test Results Call Center:  [phone]  Direct Line: [phone]

## 2021-07-25 ENCOUNTER — Other Ambulatory Visit (INDEPENDENT_AMBULATORY_CARE_PROVIDER_SITE_OTHER): Payer: Self-pay | Admitting: Family Medicine
